# Patient Record
Sex: Male | Born: 1980
Health system: Southern US, Community
[De-identification: ages and names within clinical notes are randomized; demographics above are authoritative.]

---

## 1999-07-23 ENCOUNTER — Emergency Department (HOSPITAL_COMMUNITY): Admission: EM | Admit: 1999-07-23 | Discharge: 1999-07-23 | Payer: Self-pay | Admitting: Emergency Medicine

## 2009-02-02 ENCOUNTER — Emergency Department (HOSPITAL_COMMUNITY): Admission: EM | Admit: 2009-02-02 | Discharge: 2009-02-02 | Payer: Self-pay | Admitting: Emergency Medicine

## 2010-09-14 IMAGING — CT CT ABDOMEN W/ CM
2 of 4 series · 17 of 46 positions shown, 19 images · IV contrast (agent unspecified)
Comparison: None.

CT ABDOMEN

CLINICAL DATA: Mid abdominal pain and vomiting.

CT ABDOMEN AND PELVIS WITH CONTRAST
TECHNIQUE: Multidetector CT imaging of the abdomen and pelvis was
performed using the standard protocol following bolus
administration of intravenous contrast.
Contrast: 100 ml [id number]

[Series 2: rtn a/p with · axial · 0.63mm/px · z∈[-472,-56]mm · 14 of 91 slices shown, 16 images]
[im 4/91  soft-tissue]
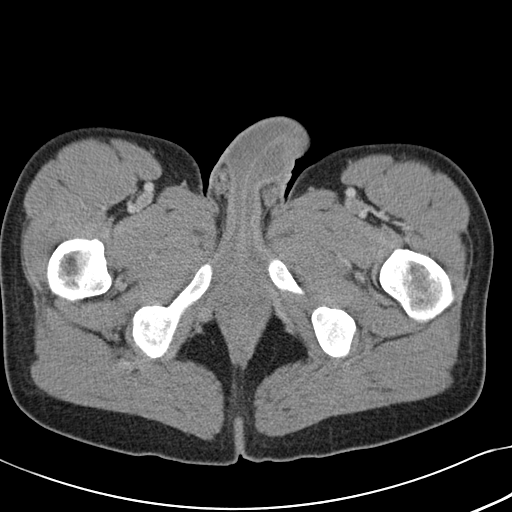
[im 4/91  bone]
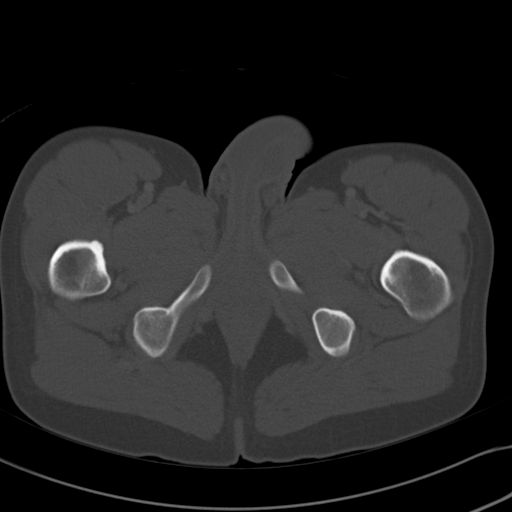
[im 11/91  soft-tissue]
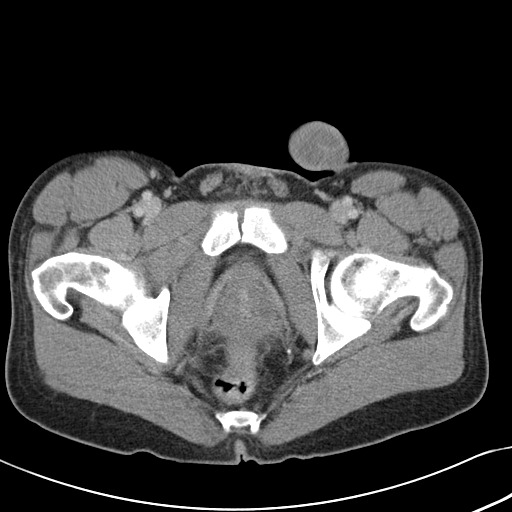
[im 19/91  soft-tissue]
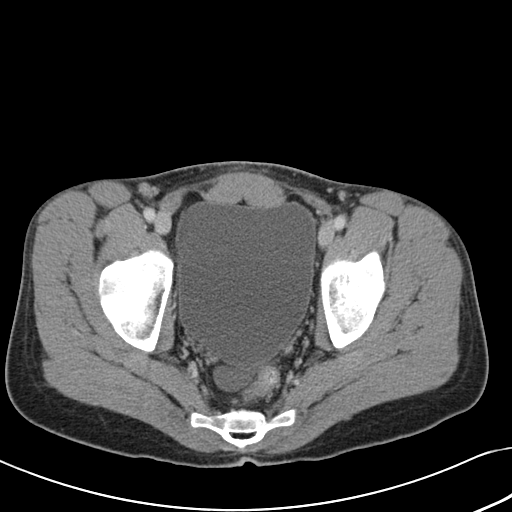
[im 26/91  soft-tissue]
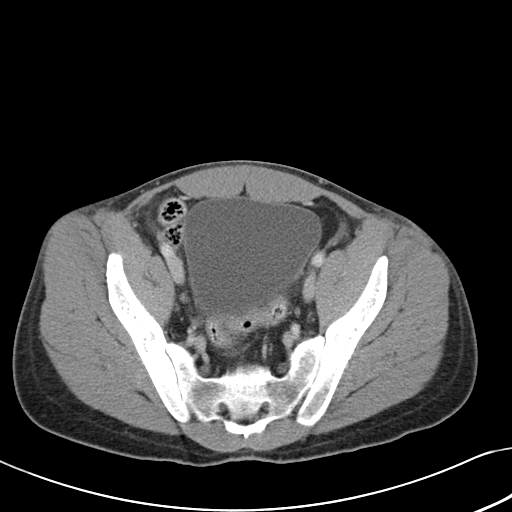
[im 29/91  soft-tissue]
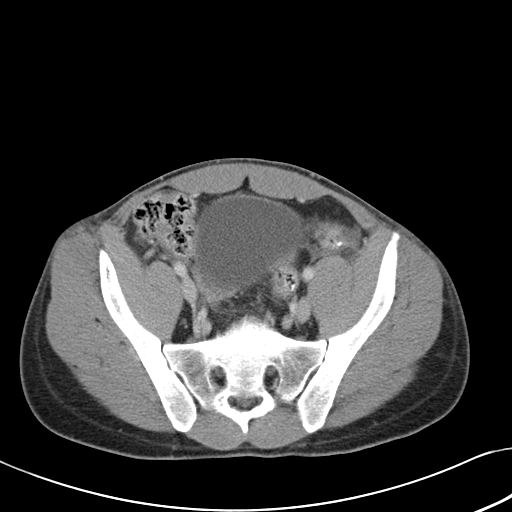
[im 37/91  soft-tissue]
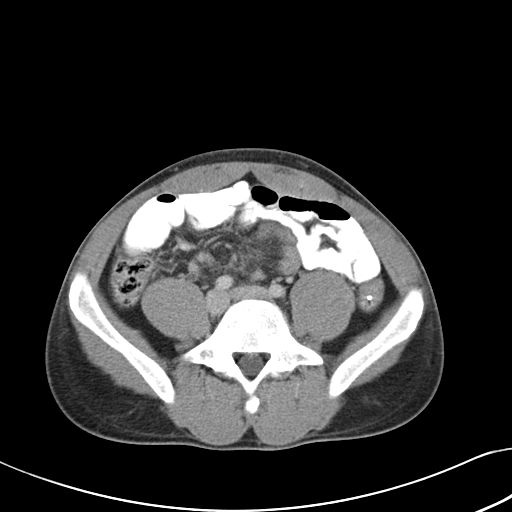
[im 44/91  soft-tissue]
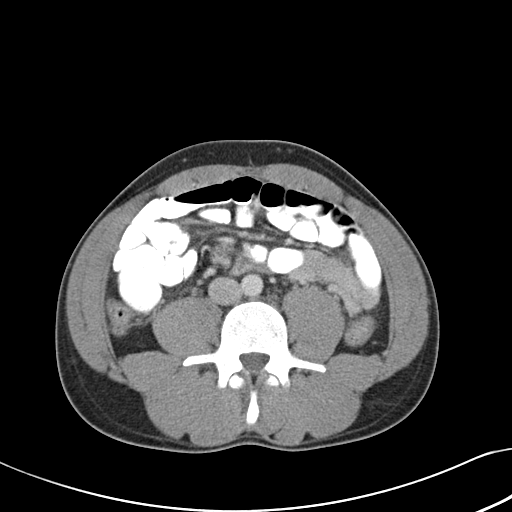
[im 47/91  soft-tissue]
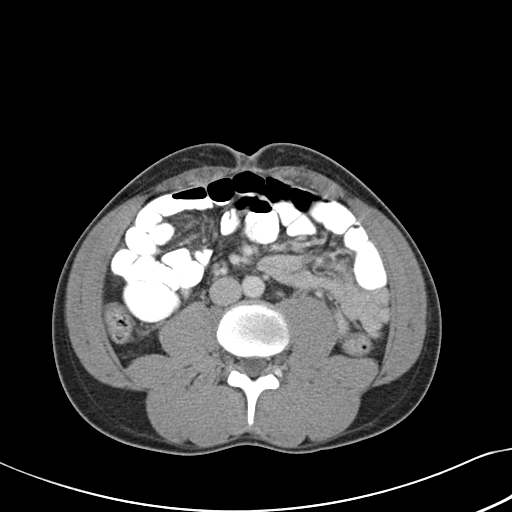
[im 55/91  soft-tissue]
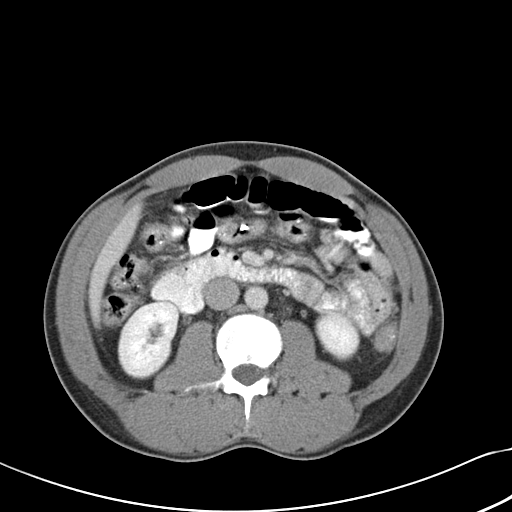
[im 55/91  bone]
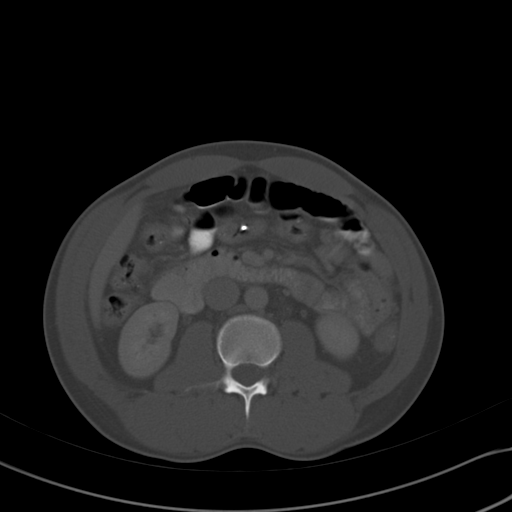
[im 62/91  soft-tissue]
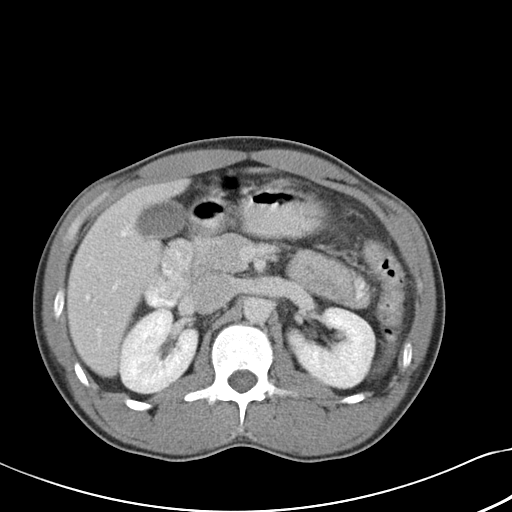
[im 69/91  soft-tissue]
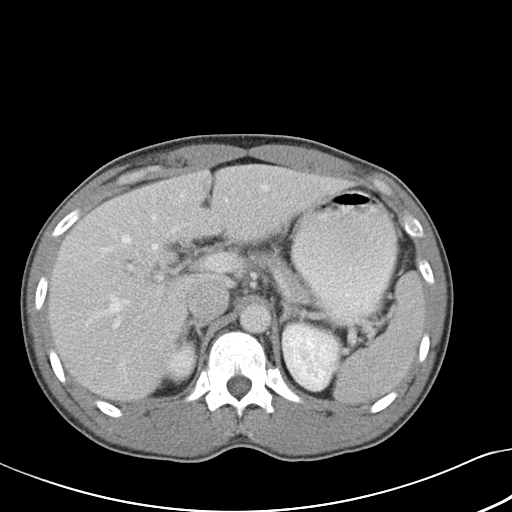
[im 73/91  soft-tissue]
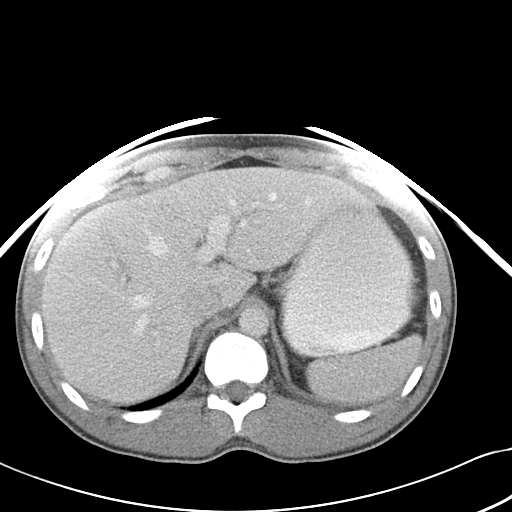
[im 80/91  soft-tissue]
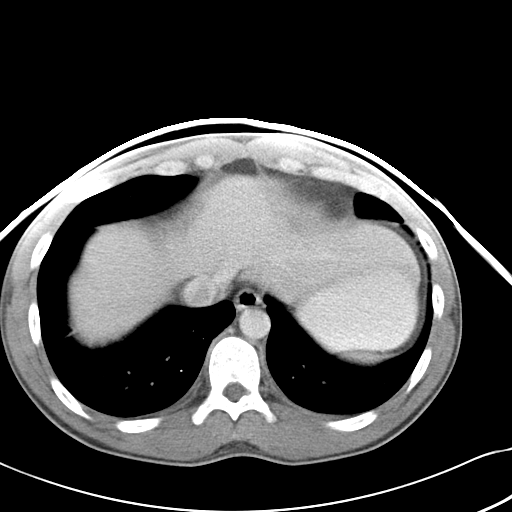
[im 87/91  soft-tissue]
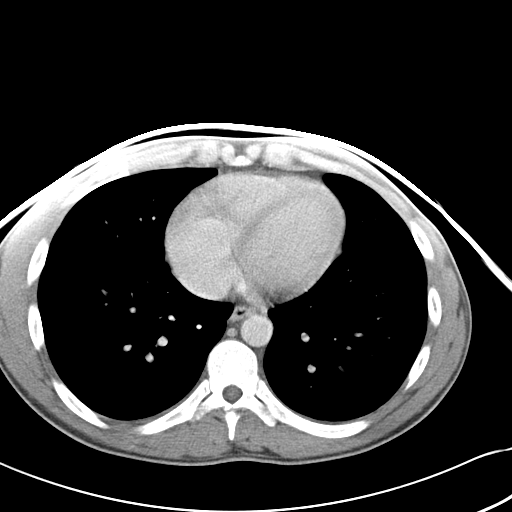

[Series 602: <mpr thick range> · coronal · 0.88mm/px · 3 of 65 slices shown]
[im 22/65  soft-tissue]
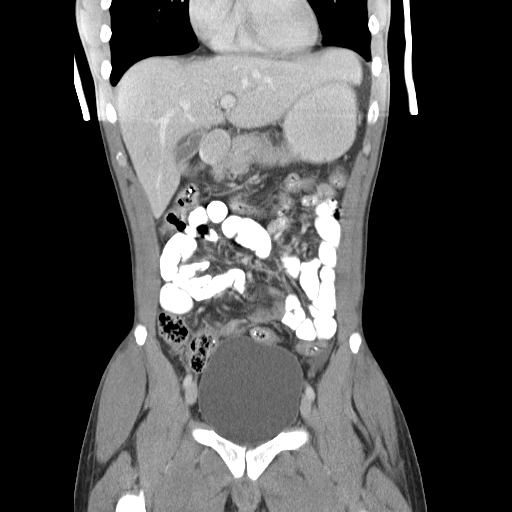
[im 29/65  soft-tissue]
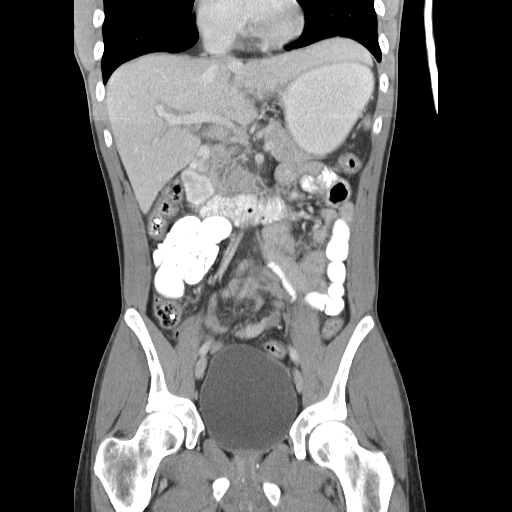
[im 36/65  soft-tissue]
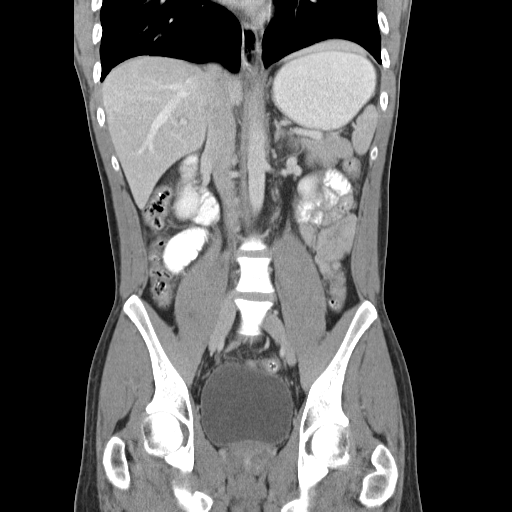

[17 of 46 positions shown; findings below may reference images not displayed]

FINDINGS: Lung bases show minimal subpleural atelectasis
dependently.  Heart size normal.  No pericardial or pleural
effusion.

Liver, gallbladder, adrenal glands, kidneys, spleen, pancreas,
stomach and small bowel are unremarkable.  No pathologically
enlarged lymph nodes.  No free fluid.
IMPRESSION: No acute findings in the abdomen.

CT PELVIS
FINDINGS: There is a small amount of free fluid.  Colon and
appendix are unremarkable.  No pathologically enlarged lymph nodes.
No worrisome lytic or sclerotic lesions.
IMPRESSION: Small amount of free fluid.

## 2011-03-27 LAB — POCT I-STAT, CHEM 8
BUN: 5 mg/dL — ABNORMAL LOW (ref 6–23)
Creatinine, Ser: 1 mg/dL (ref 0.4–1.5)
Glucose, Bld: 106 mg/dL — ABNORMAL HIGH (ref 70–99)
Hemoglobin: 15.3 g/dL (ref 13.0–17.0)
Potassium: 3.5 mEq/L (ref 3.5–5.1)

## 2011-03-27 LAB — CBC
Platelets: 151 10*3/uL (ref 150–400)
RDW: 13 % (ref 11.5–15.5)

## 2011-03-27 LAB — DIFFERENTIAL
Basophils Relative: 0 % (ref 0–1)
Monocytes Absolute: 0.6 10*3/uL (ref 0.1–1.0)
Monocytes Relative: 10 % (ref 3–12)
Neutro Abs: 3.8 10*3/uL (ref 1.7–7.7)

## 2011-03-27 LAB — COMPREHENSIVE METABOLIC PANEL
ALT: 13 U/L (ref 0–53)
Albumin: 4.1 g/dL (ref 3.5–5.2)
Alkaline Phosphatase: 80 U/L (ref 39–117)
BUN: 8 mg/dL (ref 6–23)
Potassium: 3.5 mEq/L (ref 3.5–5.1)
Sodium: 133 mEq/L — ABNORMAL LOW (ref 135–145)
Total Protein: 7 g/dL (ref 6.0–8.3)

## 2020-11-02 ENCOUNTER — Ambulatory Visit: Admit: 2020-11-02 | Disposition: A | Discharge status: Home / Self Care | Payer: Self-pay

## 2020-11-02 ENCOUNTER — Encounter: Payer: Self-pay | Admitting: Emergency Medicine

## 2020-11-02 ENCOUNTER — Ambulatory Visit
Admission: EM | Admit: 2020-11-02 | Discharge: 2020-11-02 | Disposition: A | Discharge status: Home / Self Care | Payer: 59 | Attending: Emergency Medicine | Admitting: Emergency Medicine

## 2020-11-02 ENCOUNTER — Ambulatory Visit: Payer: 59

## 2020-11-02 ENCOUNTER — Ambulatory Visit (INDEPENDENT_AMBULATORY_CARE_PROVIDER_SITE_OTHER): Payer: 59

## 2020-11-02 ENCOUNTER — Other Ambulatory Visit: Payer: Self-pay

## 2020-11-02 DIAGNOSIS — J209 Acute bronchitis, unspecified: Secondary | ICD-10-CM | POA: Diagnosis not present

## 2020-11-02 DIAGNOSIS — R059 Cough, unspecified: Secondary | ICD-10-CM | POA: Diagnosis not present

## 2020-11-02 MED ORDER — PREDNISONE 10 MG (21) PO TBPK: ORAL_TABLET | Freq: Every day | ORAL | 0 refills | Status: AC

## 2020-11-02 MED ORDER — AMOXICILLIN-POT CLAVULANATE 875-125 MG PO TABS: 1.0000 | ORAL_TABLET | Freq: Two times a day (BID) | ORAL | 0 refills | Status: AC

## 2020-11-02 MED ORDER — ALBUTEROL SULFATE HFA 108 (90 BASE) MCG/ACT IN AERS: 2.0000 | INHALATION_SPRAY | RESPIRATORY_TRACT | 0 refills | Status: DC | PRN

## 2020-11-02 NOTE — ED Triage Notes (Addendum)
Patient c/o nonproductive cough x 1.5 months.   Patient was seen at an Urgent Care center 3 weeks ago and was prescribed a cough suppressant and Azithromycin.  Patient stated symptoms didn't resolve after finishing medication.   Patient endorses chest pressure when laying down. Patient stated " I have difficulty sleeping at night and have to put several pillows under my head to get comfortable , but some days are better than others, symptoms always worsen when laying down".   Patient endorses sneezing.   Patient states he has an appointment with a specialist for possible allergies.   History of Tobacco Use.

## 2020-11-02 NOTE — ED Provider Notes (Signed)
[name]    CSN: [id number] Arrival date & time: 11/02/20  1435      History   Chief Complaint Chief Complaint  Patient presents with  . Cough    HPI Justin Stephenson is a 39 y.o. male.   Patient presents with 1 month history of cough productive of unknown color phlegm.  He states when he is coughing or lying down he has chest pressure.  He also reports sneezing and congestion.  He states he was seen at a different urgent care 3 weeks ago and treated with Zithromax but his symptoms did not improve.  He denies fever, sore throat, shortness of breath, vomiting, diarrhea, or other symptoms.  He denies pertinent medical history.  The history is provided by the patient.    History reviewed. No pertinent past medical history.  There are no problems to display for this patient.   History reviewed. No pertinent surgical history.     Home Medications    Prior to Admission medications   Medication Sig Start Date End Date Taking? Authorizing Provider  albuterol (VENTOLIN HFA) 108 (90 Base) MCG/ACT inhaler Inhale 2 puffs into the lungs every 4 (four) hours as needed for wheezing or shortness of breath. 11/02/20   Mickie Bail, NP  amoxicillin-clavulanate (AUGMENTIN) 875-125 MG tablet Take 1 tablet by mouth every 12 (twelve) hours. 11/02/20   Mickie Bail, NP  predniSONE (STERAPRED UNI-PAK 21 TAB) 10 MG (21) TBPK tablet Take by mouth daily. As directed 11/02/20   Mickie Bail, NP    Family History History reviewed. No pertinent family history.  Social History Social History   Tobacco Use  . Smoking status: Current Every Day Smoker    Packs/day: 0.25    Types: Cigarettes  . Smokeless tobacco: Never Used  Substance Use Topics  . Alcohol use: Yes    Alcohol/week: 12.0 standard drinks    Types: 12 Cans of beer per week  . Drug use: Never     Allergies   Patient has no known allergies.   Review of Systems Review of Systems  Constitutional: Negative for  chills and fever.  HENT: Positive for congestion and sneezing. Negative for ear pain and sore throat.   Eyes: Negative for pain and visual disturbance.  Respiratory: Positive for cough and chest tightness. Negative for shortness of breath.   Cardiovascular: Negative for chest pain and palpitations.  Gastrointestinal: Negative for abdominal pain, diarrhea and vomiting.  Genitourinary: Negative for dysuria and hematuria.  Musculoskeletal: Negative for arthralgias and back pain.  Skin: Negative for color change and rash.  Neurological: Negative for seizures and syncope.  All other systems reviewed and are negative.    Physical Exam Triage Vital Signs ED Triage Vitals [11/02/20 1456]  Enc Vitals Group     BP 113/77     Pulse Rate 80     Resp 16     Temp 98.2 F (36.8 C)     Temp Source Oral     SpO2 97 %     Weight      Height      Head Circumference      Peak Flow      Pain Score      Pain Loc      Pain Edu?      Excl. in GC?    No data found.  Updated Vital Signs BP 113/77 (BP Location: Left Arm)   Pulse 80   Temp 98.2 F (36.8 C) (  Oral)   Resp 16   SpO2 97%   Visual Acuity Right Eye Distance:   Left Eye Distance:   Bilateral Distance:    Right Eye Near:   Left Eye Near:    Bilateral Near:     Physical Exam Vitals and nursing note reviewed.  Constitutional:      General: He is not in acute distress.    Appearance: He is well-developed.  HENT:     Head: Normocephalic and atraumatic.     Right Ear: Tympanic membrane normal.     Left Ear: Tympanic membrane normal.     Nose: Nose normal.     Mouth/Throat:     Mouth: Mucous membranes are moist.     Pharynx: Oropharynx is clear.  Eyes:     Conjunctiva/sclera: Conjunctivae normal.  Cardiovascular:     Rate and Rhythm: Normal rate and regular rhythm.     Heart sounds: Normal heart sounds.  Pulmonary:     Effort: Pulmonary effort is normal. No respiratory distress.     Breath sounds: Rhonchi present.    Abdominal:     Palpations: Abdomen is soft.     Tenderness: There is no abdominal tenderness. There is no guarding or rebound.  Musculoskeletal:     Cervical back: Neck supple.  Skin:    General: Skin is warm and dry.     Findings: No rash.  Neurological:     General: No focal deficit present.     Mental Status: He is alert and oriented to person, place, and time.     Gait: Gait normal.  Psychiatric:        Mood and Affect: Mood normal.        Behavior: Behavior normal.      UC Treatments / Results  Labs (all labs ordered are listed, but only abnormal results are displayed) Labs Reviewed - No data to display  EKG   Radiology DG Chest 2 View  Result Date: 11/02/2020 CLINICAL DATA:  Productive cough for several weeks EXAM: CHEST - 2 VIEW COMPARISON:  None. FINDINGS: Cardiac shadow is within normal limits. Mild increased bronchitic markings are noted of uncertain chronicity. No focal confluent infiltrate is seen. No effusion is noted. No bony abnormality is noted. IMPRESSION: Mild increased bronchitic markings of uncertain chronicity. No other focal abnormality is noted. Electronically Signed   By: Alcide Clever M.D.   On: 11/02/2020 15:36    Procedures Procedures (including critical care time)  Medications Ordered in UC Medications - No data to display  Initial Impression / Assessment and Plan / UC Course  I have reviewed the triage vital signs and the nursing notes.  Pertinent labs & imaging results that were available during my care of the patient were reviewed by me and considered in my medical decision making (see chart for details).   Acute bronchitis.  Patient declines EKG or transfer to the ED. x-ray shows "mild increased bronchitic markings of uncertain chronicity."  Treating with Augmentin, prednisone, albuterol inhaler.  Instructed patient to follow-up with his PCP if his symptoms are not improving.  Patient agrees to plan of care.   Final Clinical  Impressions(s) / UC Diagnoses   Final diagnoses:  Acute bronchitis, unspecified organism     Discharge Instructions     Use the albuterol inhaler and take the Augmentin and prednisone as directed.    Follow up with your primary care provider if your symptoms are not improving.       ED  Prescriptions    Medication Sig Dispense Auth. Provider   amoxicillin-clavulanate (AUGMENTIN) 875-125 MG tablet Take 1 tablet by mouth every 12 (twelve) hours. 14 tablet Mickie Bail, NP   predniSONE (STERAPRED UNI-PAK 21 TAB) 10 MG (21) TBPK tablet Take by mouth daily. As directed 21 tablet Mickie Bail, NP   albuterol (VENTOLIN HFA) 108 (90 Base) MCG/ACT inhaler Inhale 2 puffs into the lungs every 4 (four) hours as needed for wheezing or shortness of breath. 18 g Mickie Bail, NP     I have reviewed the PDMP during this encounter.   Mickie Bail, NP 11/02/20 1600

## 2020-11-02 NOTE — Discharge Instructions (Addendum)
Use the albuterol inhaler and take the Augmentin and prednisone as directed.    Follow up with your primary care provider if your symptoms are not improving.

## 2020-11-08 ENCOUNTER — Institutional Professional Consult (permissible substitution): Payer: Self-pay | Admitting: Pulmonary Disease

## 2021-04-13 ENCOUNTER — Ambulatory Visit
Admission: EM | Admit: 2021-04-13 | Discharge: 2021-04-13 | Disposition: A | Discharge status: Home / Self Care | Payer: 59

## 2021-04-13 ENCOUNTER — Other Ambulatory Visit: Payer: Self-pay

## 2021-04-13 ENCOUNTER — Encounter: Payer: Self-pay | Admitting: Emergency Medicine

## 2021-04-13 DIAGNOSIS — B349 Viral infection, unspecified: Secondary | ICD-10-CM | POA: Diagnosis not present

## 2021-04-13 DIAGNOSIS — R509 Fever, unspecified: Secondary | ICD-10-CM | POA: Diagnosis not present

## 2021-04-13 MED ORDER — ACETAMINOPHEN 325 MG PO TABS
650.0000 mg | ORAL_TABLET | Freq: Once | ORAL | Status: AC
  Administered 2021-04-13: 650 mg via ORAL

## 2021-04-13 MED ORDER — ALBUTEROL SULFATE HFA 108 (90 BASE) MCG/ACT IN AERS: 2.0000 | INHALATION_SPRAY | RESPIRATORY_TRACT | 0 refills | Status: AC | PRN

## 2021-04-13 NOTE — ED Triage Notes (Signed)
Symptoms started last night.  Nothing specific, but not feeling well.  Today feeling weak, has cough, and headache. Patient is also complaining of chest congestion.  Has had delsym this morning.  Patient used inhaler last night

## 2021-04-13 NOTE — Discharge Instructions (Addendum)
Your COVID and Influenza tests are pending.  You should self quarantine until the test results are back.    Take Tylenol or ibuprofen as needed for fever or discomfort.  Rest and keep yourself hydrated.    Follow-up with your primary care provider if your symptoms are not improving.     

## 2021-04-13 NOTE — ED Provider Notes (Signed)
Justin Stephenson    CSN: [id number] Arrival date & time: 04/13/21  1522      History   Chief Complaint Chief Complaint  Patient presents with  . Cough    HPI Justin Stephenson is a 40 y.o. male.   Patient presents with fever, fatigue, generalized weakness, headache, nonproductive cough since yesterday evening.  He denies wheezing, shortness of breath, vomiting, diarrhea, or other symptoms.  Treatment attempted at home with Delsym and albuterol inhaler yesterday evening; no treatments today.  He denies pertinent medical history.  The history is provided by the patient and the spouse.    History reviewed. No pertinent past medical history.  There are no problems to display for this patient.   History reviewed. No pertinent surgical history.     Home Medications    Prior to Admission medications   Medication Sig Start Date End Date Taking? Authorizing Provider  Dextromethorphan HBr (DELSYM PO) Take by mouth.   Yes [provider]  albuterol (VENTOLIN HFA) 108 (90 Base) MCG/ACT inhaler Inhale 2 puffs into the lungs every 4 (four) hours as needed for wheezing or shortness of breath. 04/13/21   Mickie Bail, NP  amoxicillin-clavulanate (AUGMENTIN) 875-125 MG tablet Take 1 tablet by mouth every 12 (twelve) hours. Patient not taking: Reported on 04/13/2021 11/02/20   Mickie Bail, NP  predniSONE (STERAPRED UNI-PAK 21 TAB) 10 MG (21) TBPK tablet Take by mouth daily. As directed Patient not taking: Reported on 04/13/2021 11/02/20   Mickie Bail, NP    Family History Family History  Problem Relation Age of Onset  . Cancer Mother   . Hypertension Father     Social History Social History   Tobacco Use  . Smoking status: Current Every Day Smoker    Packs/day: 0.25    Types: Cigarettes  . Smokeless tobacco: Never Used  Vaping Use  . Vaping Use: Never used  Substance Use Topics  . Alcohol use: Yes    Alcohol/week: 12.0 standard drinks    Types: 12 Cans of beer  per week  . Drug use: Never     Allergies   Patient has no known allergies.   Review of Systems Review of Systems  Constitutional: Positive for fatigue and fever. Negative for chills.  HENT: Negative for ear pain and sore throat.   Respiratory: Positive for cough. Negative for shortness of breath and wheezing.   Cardiovascular: Negative for chest pain and palpitations.  Gastrointestinal: Negative for abdominal pain, diarrhea and vomiting.  Musculoskeletal: Negative for arthralgias and back pain.  Skin: Negative for color change and rash.  Neurological: Positive for weakness and headaches. Negative for seizures and syncope.  All other systems reviewed and are negative.    Physical Exam Triage Vital Signs ED Triage Vitals  Enc Vitals Group     BP 04/13/21 1542 101/60     Pulse Rate 04/13/21 1542 (!) 118     Resp 04/13/21 1542 (!) 24     Temp 04/13/21 1542 (!) 102.6 F (39.2 C)     Temp src --      SpO2 04/13/21 1542 92 %     Weight --      Height --      Head Circumference --      Peak Flow --      Pain Score 04/13/21 1538 5     Pain Loc --      Pain Edu? --      Excl. in GC? --  No data found.  Updated Vital Signs BP 101/60 (BP Location: Left Arm)   Pulse (!) 118   Temp (!) 102.6 F (39.2 C)   Resp (!) 24   SpO2 95%   Visual Acuity Right Eye Distance:   Left Eye Distance:   Bilateral Distance:    Right Eye Near:   Left Eye Near:    Bilateral Near:     Physical Exam Vitals and nursing note reviewed.  Constitutional:      General: He is not in acute distress.    Appearance: He is well-developed. He is ill-appearing.  HENT:     Head: Normocephalic and atraumatic.     Right Ear: Tympanic membrane normal.     Left Ear: Tympanic membrane normal.     Nose: Nose normal.     Mouth/Throat:     Mouth: Mucous membranes are moist.     Pharynx: Oropharynx is clear.  Eyes:     Conjunctiva/sclera: Conjunctivae normal.  Cardiovascular:     Rate and  Rhythm: Normal rate and regular rhythm.     Heart sounds: Normal heart sounds.  Pulmonary:     Effort: Pulmonary effort is normal. No respiratory distress.     Breath sounds: Normal breath sounds. No wheezing or rhonchi.  Abdominal:     Palpations: Abdomen is soft.     Tenderness: There is no abdominal tenderness.  Musculoskeletal:     Cervical back: Neck supple.  Skin:    General: Skin is warm and dry.  Neurological:     General: No focal deficit present.     Mental Status: He is alert and oriented to person, place, and time.     Gait: Gait normal.  Psychiatric:        Mood and Affect: Mood normal.        Behavior: Behavior normal.      UC Treatments / Results  Labs (all labs ordered are listed, but only abnormal results are displayed) Labs Reviewed  COVID-19, FLU A+B NAA    EKG   Radiology No results found.  Procedures Procedures (including critical care time)  Medications Ordered in UC Medications  acetaminophen (TYLENOL) tablet 650 mg (650 mg Oral Given 04/13/21 1600)    Initial Impression / Assessment and Plan / UC Course  I have reviewed the triage vital signs and the nursing notes.  Pertinent labs & imaging results that were available during my care of the patient were reviewed by me and considered in my medical decision making (see chart for details).   Fever, viral illness.  Tylenol given here.  O2 sat increased to 95% on room air.  Influenza and COVID pending.  Instructed patient to self quarantine until the test results are back.  Discussed symptomatic treatment including Tylenol or ibuprofen, rest, hydration.  Strict ED precautions discussed for shortness of breath or other concerning symptoms.  Instructed patient to follow up with PCP if his symptoms are not improving.  Patient agrees to plan of care.    Final Clinical Impressions(s) / UC Diagnoses   Final diagnoses:  Fever, unspecified  Viral illness     Discharge Instructions     Your COVID  and Influenza tests are pending.  You should self quarantine until the test results are back.    Take Tylenol or ibuprofen as needed for fever or discomfort.  Rest and keep yourself hydrated.    Follow-up with your primary care provider if your symptoms are not improving.  ED Prescriptions    Medication Sig Dispense Auth. Provider   albuterol (VENTOLIN HFA) 108 (90 Base) MCG/ACT inhaler Inhale 2 puffs into the lungs every 4 (four) hours as needed for wheezing or shortness of breath. 18 g Mickie Bail, NP     PDMP not reviewed this encounter.   Mickie Bail, NP 04/13/21 [phone]

## 2021-04-15 LAB — COVID-19, FLU A+B NAA
Influenza A, NAA: NOT DETECTED
Influenza B, NAA: NOT DETECTED
SARS-CoV-2, NAA: NOT DETECTED

## 2022-06-14 IMAGING — DX DG CHEST 2V
2 series · 2 of 2 positions shown · non-contrast
Comparison: None.

CLINICAL DATA: Productive cough for several weeks

EXAM:
CHEST - 2 VIEW

[chest pa]
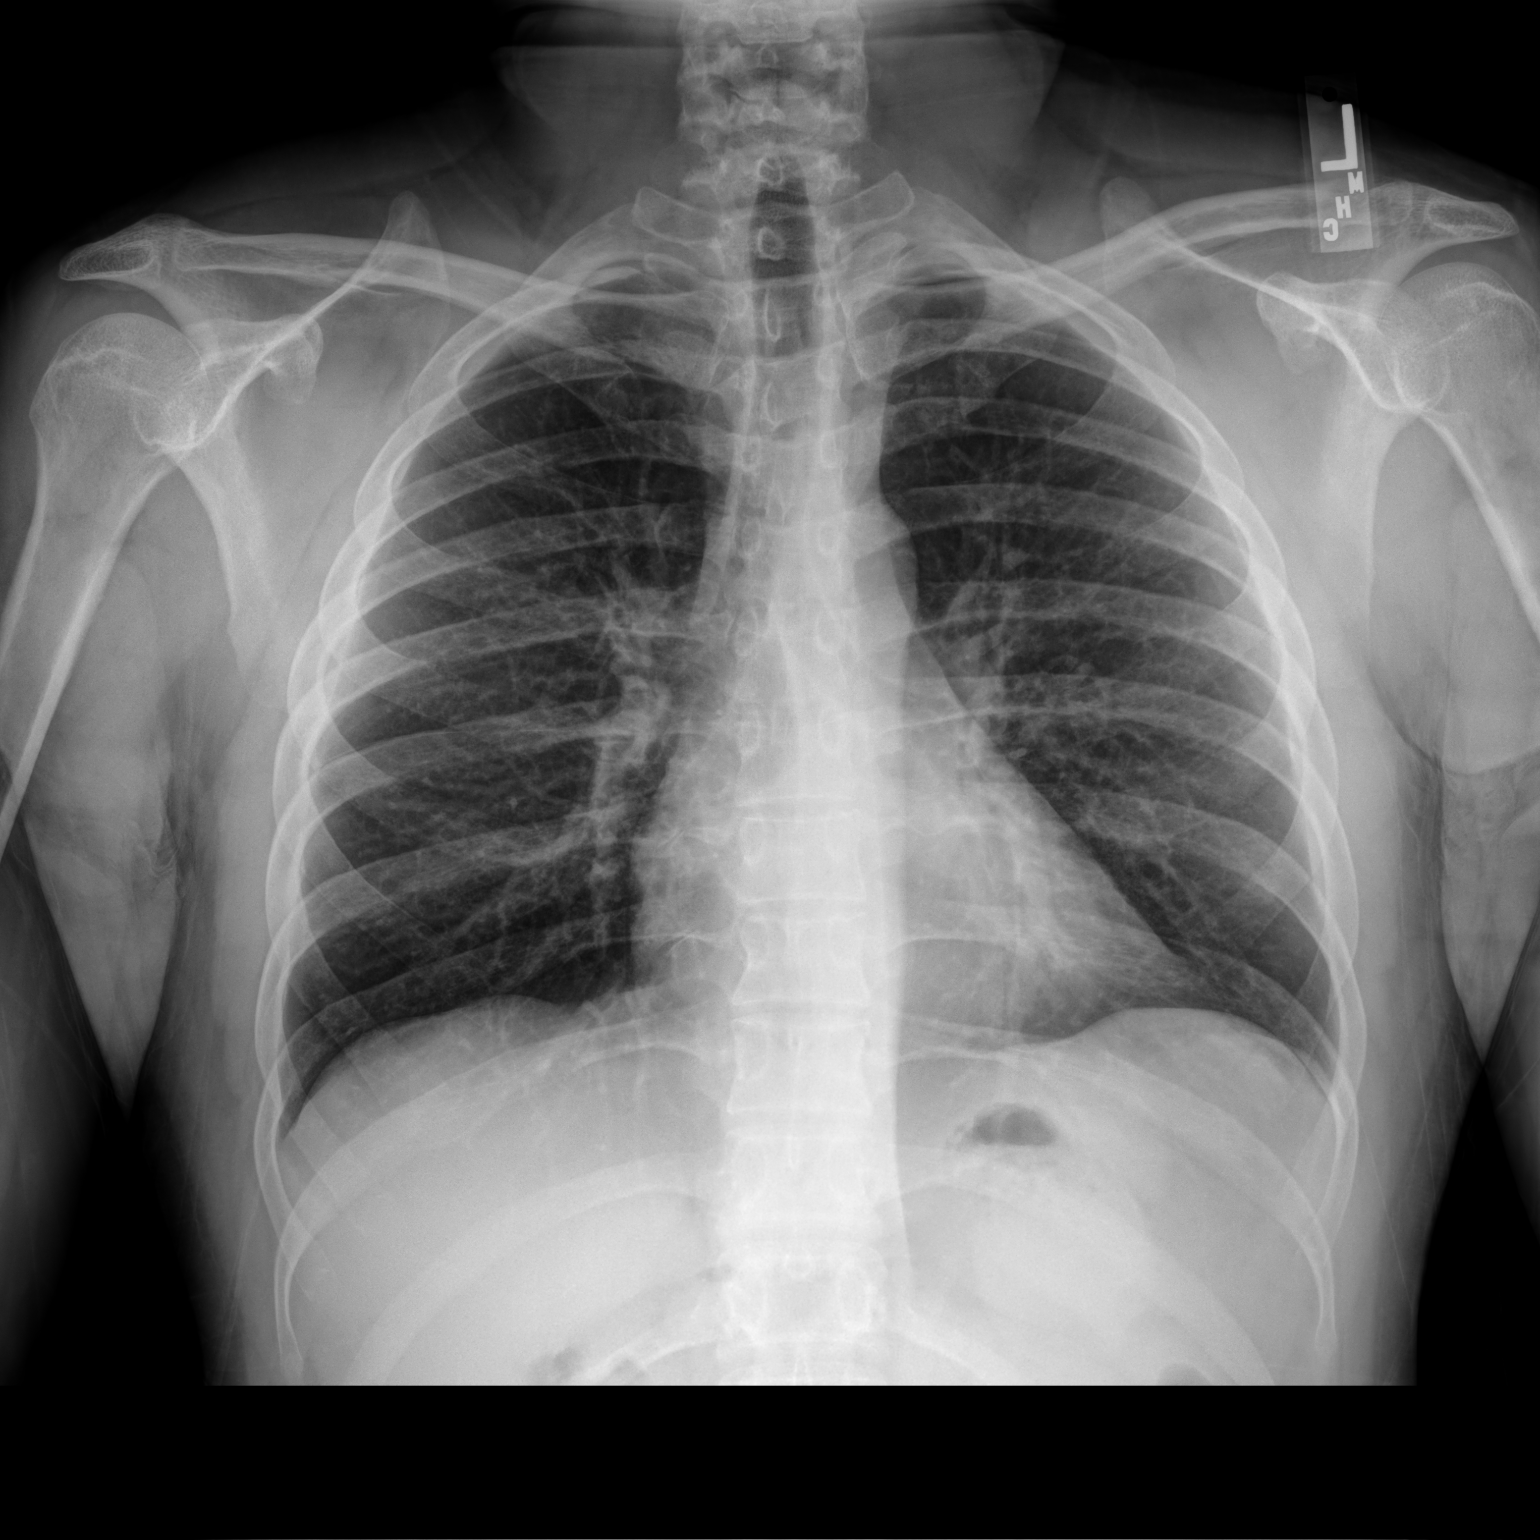

[chest lat]
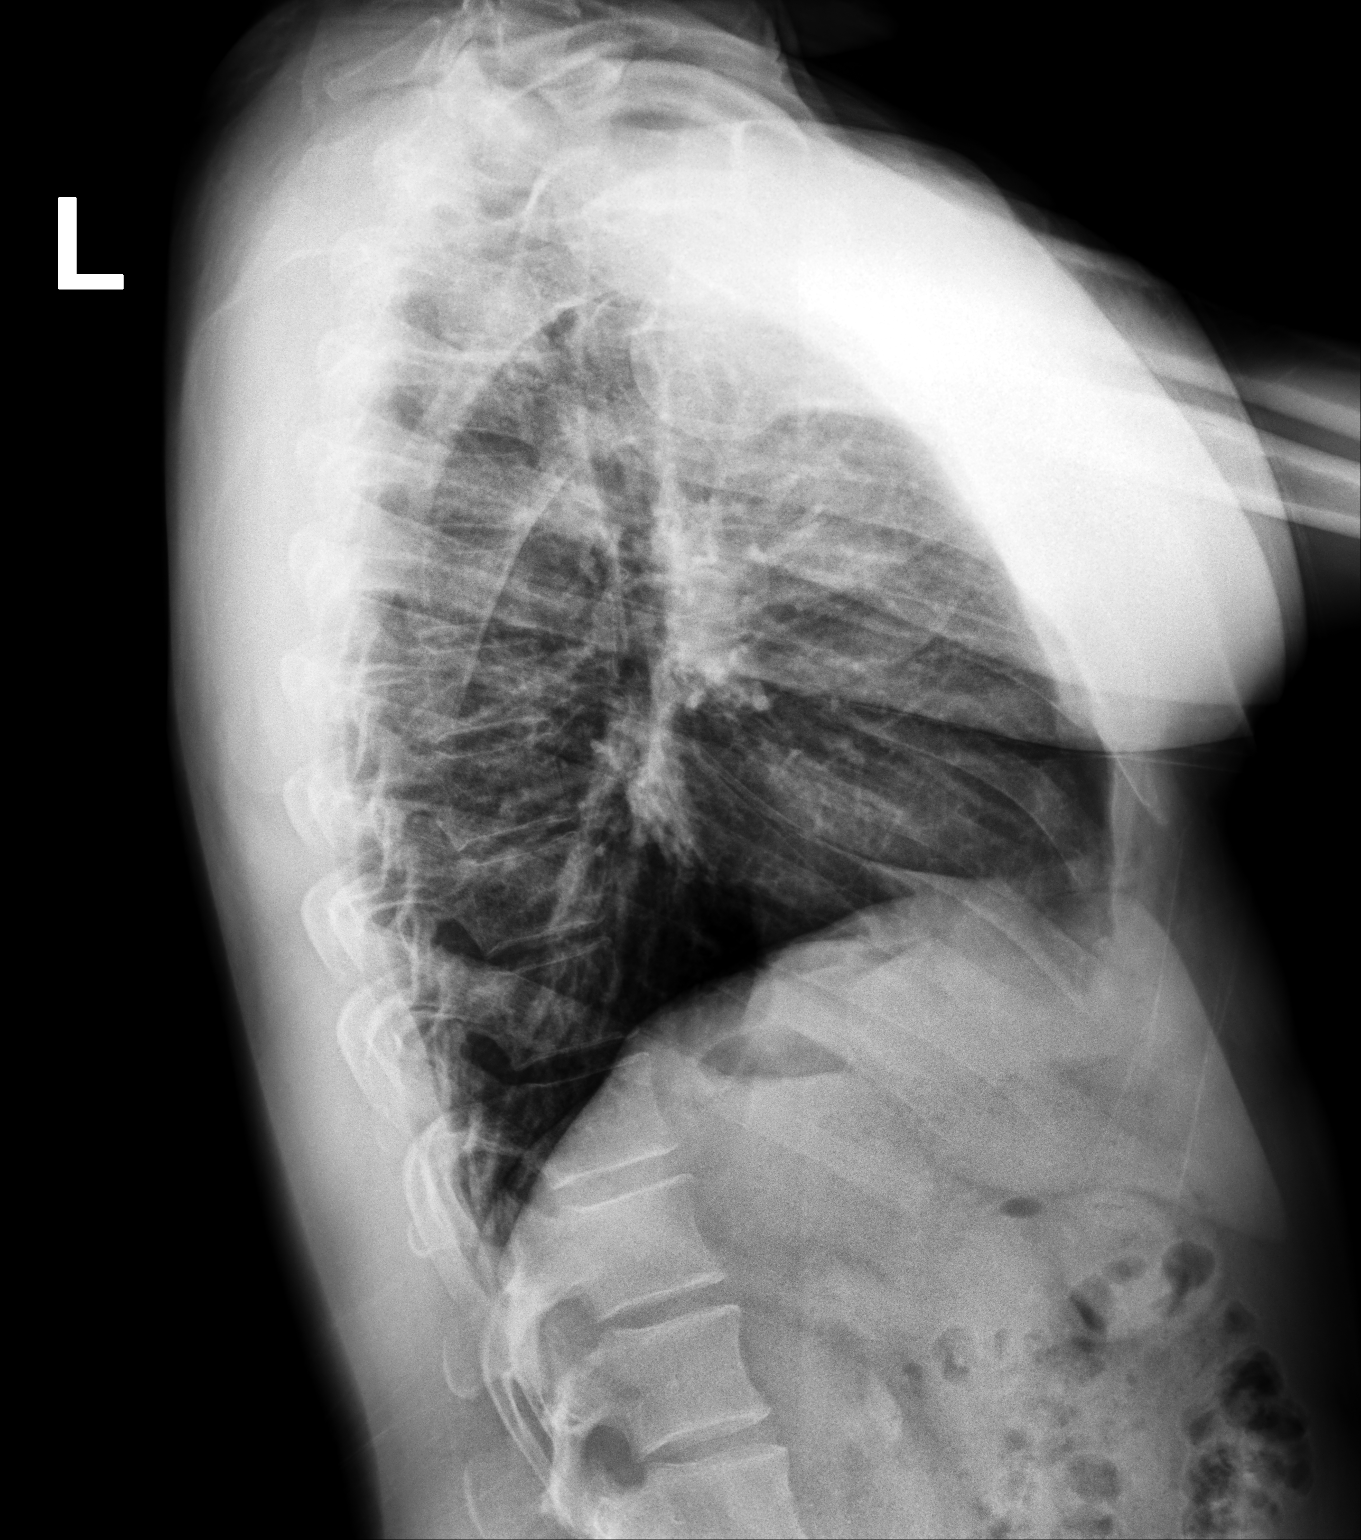

[2 of 2 positions shown; findings below may reference images not displayed]

FINDINGS: Cardiac shadow is within normal limits. Mild increased bronchitic
markings are noted of uncertain chronicity. No focal confluent
infiltrate is seen. No effusion is noted. No bony abnormality is
noted.
IMPRESSION: Mild increased bronchitic markings of uncertain chronicity. No other
focal abnormality is noted.

## 2024-02-21 NOTE — Progress Notes (Addendum)
 Subjective Patient ID: Justin Stephenson is a 43 y.o. male.  Justin Stephenson is a 43 y.o. male presents for concern of diarrhea x 2 days. Upset stomach which improved some today. Has been eating and drinking without issue. Denies abdominal pain. Also with cough x months which seemed worse last night. Intermittent shortness of breath, but not right now.    History provided by:  Patient Language interpreter used: No   Cough Associated symptoms include postnasal drip and rhinorrhea.  Diarrhea   Review of Systems  HENT:  Positive for postnasal drip and rhinorrhea.   Respiratory:  Positive for cough.   Gastrointestinal:  Positive for diarrhea.    Patient History  Allergies: No Known Allergies   History reviewed. No pertinent past medical history. History reviewed. No pertinent surgical history. Social History   Socioeconomic History  . Marital status: Married    Spouse name: Not on file  . Number of children: Not on file  . Years of education: Not on file  . Highest education level: Not on file  Occupational History  . Not on file  Tobacco Use  . Smoking status: Every Day    Types: Cigarettes  . Smokeless tobacco: Never  Substance and Sexual Activity  . Alcohol use: Not on file  . Drug use: Never  . Sexual activity: Not on file  Other Topics Concern  . Not on file  Social History Narrative  . Not on file   History reviewed. No pertinent family history. No current outpatient medications on file prior to visit.   No current facility-administered medications on file prior to visit.     Objective  Vitals:   02/21/24 1130  BP: 125/81  Pulse: 92  Resp: 18  Temp: 37.1 C (98.8 F)  TempSrc: Tympanic  SpO2: 100%  Weight: 83.9 kg  Height: 5' 5  PainSc: 0-No pain                No results found.  Physical Exam Vitals and nursing note reviewed.  Constitutional:      General: He is not in acute distress.    Appearance: Normal appearance.  HENT:      Right Ear: Tympanic membrane normal.     Left Ear: Tympanic membrane normal.     Nose: Rhinorrhea present.     Mouth/Throat:     Pharynx: Posterior oropharyngeal erythema present.     Comments: Post nasal drip noted, mild erythema Eyes:     Extraocular Movements: Extraocular movements intact.     Conjunctiva/sclera: Conjunctivae normal.     Pupils: Pupils are equal, round, and reactive to light.  Cardiovascular:     Rate and Rhythm: Normal rate and regular rhythm.     Pulses: Normal pulses.     Heart sounds: Normal heart sounds.  Pulmonary:     Effort: Pulmonary effort is normal.     Breath sounds: Normal breath sounds.  Musculoskeletal:        General: Normal range of motion.     Cervical back: Normal range of motion and neck supple. No tenderness.  Lymphadenopathy:     Cervical: No cervical adenopathy.  Skin:    General: Skin is warm.     Capillary Refill: Capillary refill takes less than 2 seconds.  Neurological:     General: No focal deficit present.     Mental Status: He is alert.  Psychiatric:        Mood and Affect: Mood normal.  Thought Content: Thought content normal.        Judgment: Judgment normal.      Results for orders placed or performed in visit on 02/21/24  POCT RAPID INFLUENZA MCKESSON  Component Result   Rapid Influenza A Ag Negative   Rapid Influenza B Ag Negative   Internal Quality Control Pass  POCT QuickVue Antigen Test  Component Result   Rapid Covid Negative   Internal Quality Control Pass     Procedures MDM:     1 Acute, uncomplicated illness or injury     Explanation of Medical Decision Making and variances from expected care:  Patient has used albuterol  with relief in past, possible asthma history but patient unsure   Review of any test results: Two     Assessment requiring historian other than patient: No     Independent visualization of image, tracing, or test: No     Discussion of management with another provider: No      Risk:: Moderate          Assessment/Plan Diagnoses and all orders for this visit:  Viral URI with cough -     POCT RAPID INFLUENZA MCKESSON -     POCT QuickVue Antigen Test -     benzonatate (Tessalon Perles) 100 MG capsule; T 1-2 tab po tid prn -     ibuprofen 600 MG tablet; Take 1 tablet (600 mg total) by mouth in the morning and 1 tablet (600 mg total) in the evening and 1 tablet (600 mg total) before bedtime. Do all this for 10 days. -     ipratropium (Atrovent) 0.06 % nasal spray; Administer 2 sprays into each nostril in the morning and 2 sprays at noon and 2 sprays in the evening and 2 sprays before bedtime.  History of environmental allergies -     ipratropium (Atrovent) 0.06 % nasal spray; Administer 2 sprays into each nostril in the morning and 2 sprays at noon and 2 sprays in the evening and 2 sprays before bedtime.  Other orders -     albuterol  108 (90 Base) MCG/ACT inhaler; Inhale 2 puffs every 6 (six) hours if needed for wheezing.      Disposition Status: Home  Progress note signed by Debby Rung, PA on 02/22/24 at  2:21 PM

## 2024-02-24 NOTE — Progress Notes (Addendum)
 Subjective Patient ID: Justin Stephenson is a 43 y.o. male.  Justin Stephenson is a 43 y.o. male who presents complaining of left sided rib pain and cough.  Patient states he had a particularly bad coughing fit last night that resulted in a sudden pain in his left lower chest wall.  He has increased pain with deep breaths, movement, and coughing.  Patient was seen on 3/14 and has been taking medications as prescribe without improvement in symptoms.     History provided by:  Patient Language interpreter used: No     Review of Systems  Constitutional:  Negative for chills and fever.  Respiratory:  Positive for cough. Negative for chest tightness, shortness of breath and wheezing.   Cardiovascular:  Positive for chest pain (left rib pain). Negative for palpitations.  Musculoskeletal:  Positive for myalgias (left chest wall).  Skin:  Negative for rash.    Patient History  Allergies: No Known Allergies   History reviewed. No pertinent past medical history. History reviewed. No pertinent surgical history. Social History   Socioeconomic History  . Marital status: Married    Spouse name: Not on file  . Number of children: Not on file  . Years of education: Not on file  . Highest education level: Not on file  Occupational History  . Not on file  Tobacco Use  . Smoking status: Every Day    Types: Cigarettes  . Smokeless tobacco: Never  Substance and Sexual Activity  . Alcohol use: Not on file  . Drug use: Never  . Sexual activity: Not on file  Other Topics Concern  . Not on file  Social History Narrative  . Not on file   History reviewed. No pertinent family history. Current Outpatient Medications on File Prior to Visit  Medication Sig Dispense Refill  . albuterol  108 (90 Base) MCG/ACT inhaler Inhale 2 puffs every 6 (six) hours if needed for wheezing. 18 g 0  . benzonatate (Tessalon Perles) 100 MG capsule T 1-2 tab po tid prn 20 capsule 0  . ibuprofen 600 MG tablet Take 1 tablet  (600 mg total) by mouth in the morning and 1 tablet (600 mg total) in the evening and 1 tablet (600 mg total) before bedtime. Do all this for 10 days. 30 tablet 0  . ipratropium (Atrovent) 0.06 % nasal spray Administer 2 sprays into each nostril in the morning and 2 sprays at noon and 2 sprays in the evening and 2 sprays before bedtime. 15 mL 0   No current facility-administered medications on file prior to visit.     Objective  Vitals:   02/24/24 1032  BP: 112/78  Pulse: 84  Resp: 19  Temp: 36.9 C (98.5 F)  TempSrc: Tympanic  SpO2: 99%  Weight: 83.9 kg  Height: 5' 5  PainSc: 10-Worst pain ever                No results found.  Physical Exam Vitals and nursing note reviewed.  Constitutional:      General: He is not in acute distress.    Appearance: Normal appearance. He is not ill-appearing.     Comments: Patient holding his left ribs and appears to be in pain.  Cardiovascular:     Rate and Rhythm: Normal rate and regular rhythm.     Heart sounds: Normal heart sounds.  Pulmonary:     Effort: Pulmonary effort is normal.     Breath sounds: Normal breath sounds and air entry.  Comments: Patient speaking in full sentences and is ambulatory in the clinic without increased work of breathing.  Patient does appear to be in pain with taking deep breaths. Chest:     Chest wall: Tenderness present. No deformity, swelling, crepitus or edema.     Comments: Left lateral lower chest wall tenderness around ribs 8-10.  No palpable crepitus or deformities.   Skin:    General: Skin is warm and dry.  Neurological:     Mental Status: He is alert.  Psychiatric:        Mood and Affect: Mood normal.        Behavior: Behavior normal. Behavior is cooperative.      No results found for this visit on 02/24/24.   Procedures MDM:     1 Acute complicated illness or injury     Explanation of Medical Decision Making and variances from expected care:    Preliminary read of x-ray  without acute rib fracture.  No pneumothorax.  Suspect patient strained chest wall from severe coughing episode.  RX meloxicam and flexeril to treat chest wall pain and muscle spasms.  RX promethazine-DM to help suppress cough so chest wall symptoms can improve.  Patient was prescribed albuterol  inhaler at last visit which he does use whenever he experiences shortness of breath.  He has nebulizer machine at home and is requesting neb treatments for this.  RX albuterol  neb solution sent to pharmacy.  Advised patient he should follow up with PCP regarding his ongoing cough as it may be caused by underlying condition such as asthma or chronic bronchitis.  Discussed supportive care of symptoms.  ER precautions given.     Assessment requiring historian other than patient: No     Independent visualization of image, tracing, or test: No     Discussion of management with another provider: No     Risk:: Moderate          Assessment/Plan Diagnoses and all orders for this visit:  Rib pain on left side -     XR ribs 3 views unilateral including posterior Chest  Acute cough -     XR ribs 3 views unilateral including posterior Chest         Progress note signed by Gerard Gaskins, PA on 02/24/24 at 11:19 AM

## 2024-02-24 NOTE — Progress Notes (Signed)
 Justin Stephenson is a 43 y.o. male presenting to clinic for follow up on cough seen 3/14

## 2024-08-19 ENCOUNTER — Ambulatory Visit

## 2024-08-19 VITALS — BP 118/83 | HR 81 | Temp 98.1°F | Ht 65.0 in | Wt 176.8 lb

## 2024-08-19 DIAGNOSIS — F17210 Nicotine dependence, cigarettes, uncomplicated: Secondary | ICD-10-CM

## 2024-08-19 DIAGNOSIS — J4540 Moderate persistent asthma, uncomplicated: Secondary | ICD-10-CM

## 2024-08-19 MED ORDER — BUDESONIDE-FORMOTEROL FUMARATE 160-4.5 MCG/ACT IN AERO: 2.0000 | INHALATION_SPRAY | Freq: Two times a day (BID) | RESPIRATORY_TRACT | 12 refills | Status: AC

## 2024-08-19 NOTE — Progress Notes (Signed)
 Subjective:   PATIENT ID: Justin Stephenson GENDER: male DOB: 05/11/1981, MRN: [id number]   HPI 43 year old male with no prior medical history presenting to the clinic with chronic cough at least for 3 years.  Patient states that he has significant improvement in his symptoms when he takes albuterol .  He reports that the cough is seasonal mostly during wintertime but he has been experiencing symptoms more often.  Recent this year, he has been using albuterol  for his cough and chest tightness almost twice a day.  He reports significant symptom improvement with albuterol .  He also reports chest congestion and mucus production.  He is not able to recall the color of his mucus.  He denies any fevers or chills.  He denies any weight loss or loss of appetite.  He smokes half a pack a day since the age of 46 (12-pack-year smoking history).   He denies any allergies.  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  He has 2 dogs at home.  He states that when he touches the dogs and then touches his face he gets congested.   History reviewed. No pertinent past medical history.   Family History  Problem Relation Age of Onset   Cancer Mother    Hypertension Father      Social History   Socioeconomic History   Marital status: Married    Spouse name: Not on file   Number of children: Not on file   Years of education: Not on file   Highest education level: Not on file  Occupational History   Not on file  Tobacco Use   Smoking status: Every Day    Current packs/day: 0.50    Average packs/day: 0.5 packs/day for 20.7 years (10.3 ttl pk-yrs)    Types: Cigarettes    Start date: 2005   Smokeless tobacco: Never   Tobacco comments:    20 years  1/2 pack a day mmp   Vaping Use   Vaping status: Never Used  Substance and Sexual Activity   Alcohol use: Yes    Alcohol/week: 12.0 standard drinks of alcohol    Types: 12 Cans of beer per week   Drug use: Never   Sexual  activity: Yes  Other Topics Concern   Not on file  Social History Narrative   Not on file   Social Drivers of Health   Financial Resource Strain: Not on file  Food Insecurity: Not on file  Transportation Needs: Not on file  Physical Activity: Not on file  Stress: Not on file  Social Connections: Unknown (04/20/2022)   Received [facility]   Social Network    Social Network: Not on file  Intimate Partner Violence: Unknown (03/14/2022)   Received from [facility]   HITS    Physically Hurt: Not on file    Insult or Talk Down To: Not on file    Threaten Physical Harm: Not on file    Scream or Curse: Not on file     No Known Allergies   Outpatient Medications Prior to Visit  Medication Sig Dispense Refill   albuterol  (VENTOLIN  HFA) 108 (90 Base) MCG/ACT inhaler Inhale 2 puffs into the lungs every 4 (four) hours as needed for wheezing or shortness of breath. 18 g 0   amoxicillin -clavulanate (AUGMENTIN ) 875-125 MG tablet Take 1 tablet by mouth every 12 (twelve) hours. (Patient not taking: Reported on 08/19/2024) 14 tablet 0   Dextromethorphan HBr (DELSYM PO)  Take by mouth. (Patient not taking: Reported on 08/19/2024)     predniSONE  (STERAPRED UNI-PAK 21 TAB) 10 MG (21) TBPK tablet Take by mouth daily. As directed (Patient not taking: Reported on 08/19/2024) 21 tablet 0   No facility-administered medications prior to visit.    ROS Reviewed all systems and reported negative except as above     Objective:   Vitals:   08/19/24 1604  BP: 118/83  Pulse: 81  Temp: 98.1 F (36.7 C)  TempSrc: Oral  SpO2: 94%  Weight: 176 lb 12.8 oz (80.2 kg)  Height: 5' 5 (1.651 m)    Physical Exam General: Middle-aged male, healthy appearing not in acute distress Chest: Clear to auscultation Heart: Regular rate and rhythm, normal S1, normal S2     CBC    Component Value Date/Time   WBC 6.2 02/02/2009 0320   RBC 4.65 02/02/2009 0320   HGB 15.3 02/02/2009 1113   HCT 45.0  02/02/2009 1113   PLT 151 02/02/2009 0320   MCV 94.0 02/02/2009 0320   MCHC 34.1 02/02/2009 0320   RDW 13.0 02/02/2009 0320   LYMPHSABS 1.5 02/02/2009 0320   MONOABS 0.6 02/02/2009 0320   EOSABS 0.3 02/02/2009 0320   BASOSABS 0.0 02/02/2009 0320     Chest imaging:  PFT: No PFTs on file   Labs: None recent laboratory workup   Echo:   No echo on file    Assessment & Plan:   Assessment & Plan Moderate persistent asthma, unspecified whether complicated Patient clinically describing symptoms suggestive of asthma.  This is less likely to be COPD as his smoking history and age are not consistent with COPD although still possible. Plan to obtain PFTs.  In the meantime started on LABA ICS inhaler to assess if he has symptomatic improvement. Minimal GERD symptoms.  No postnasal drip.  No recent infectious etiology. Follow-up in 3 months  Orders Placed This Encounter  Procedures   CBC w/Diff   RESPIRATORY ALLERGY  [facility] II W/ RFLX: [place]    Standing Status:   Future    Expected Date:   08/19/2024    Expiration Date:   08/19/2025   Pulmonary function test    Please do a methacholine challenge test    Standing Status:   Future    Expiration Date:   08/19/2025    Where should this test be performed?:   Outpatient Pulmonary    What type of PFT is being ordered?:   Full PFT      Zola Herter, MD [facility] Office: [phone]

## 2024-08-19 NOTE — Patient Instructions (Signed)
 It was nice seeing in the clinic today.  Your cough and chest tightness with mucus production that improved with albuterol  could be related to asthma.  We will obtain pulmonary function tests to look into that.  In the meantime I suggest that you start using Symbicort  2 puffs twice a day and rinse your mouth after you use it.   Will also check your complete blood count and blood allergy  panel to further evaluate you for allergies.  Please let us  know in 2 weeks how you feel with the use of the inhaler.

## 2024-08-20 LAB — CBC WITH DIFFERENTIAL/PLATELET
Basophils Absolute: 0.1 K/uL (ref 0.0–0.1)
Basophils Relative: 1.1 % (ref 0.0–3.0)
Eosinophils Absolute: 0.7 K/uL (ref 0.0–0.7)
Eosinophils Relative: 7.5 % — ABNORMAL HIGH (ref 0.0–5.0)
HCT: 45.4 % (ref 39.0–52.0)
Hemoglobin: 15.1 g/dL (ref 13.0–17.0)
Lymphocytes Relative: 30.4 % (ref 12.0–46.0)
Lymphs Abs: 2.8 K/uL (ref 0.7–4.0)
MCHC: 33.4 g/dL (ref 30.0–36.0)
MCV: 95.3 fl (ref 78.0–100.0)
Monocytes Absolute: 0.8 K/uL (ref 0.1–1.0)
Monocytes Relative: 8.3 % (ref 3.0–12.0)
Neutro Abs: 4.9 K/uL (ref 1.4–7.7)
Neutrophils Relative %: 52.7 % (ref 43.0–77.0)
Platelets: 205 K/uL (ref 150.0–400.0)
RBC: 4.76 Mil/uL (ref 4.22–5.81)
RDW: 13.5 % (ref 11.5–15.5)
WBC: 9.3 K/uL (ref 4.0–10.5)

## 2024-08-21 ENCOUNTER — Telehealth: Payer: Self-pay

## 2024-08-21 DIAGNOSIS — J4540 Moderate persistent asthma, uncomplicated: Secondary | ICD-10-CM

## 2024-08-21 LAB — RESPIRATORY ALLERGY PANEL REGION II W/ RFLX: ~~LOC~~
Allergen, A. alternata, m6: <0.1 kU/L
Allergen, Cedar tree, t12: <0.1 kU/L
Allergen, Comm Silver Birch, t9: <0.1 kU/L
Allergen, Cottonwood, t14: <0.1 kU/L
Allergen, D pternoyssinus,d7: 0.54 kU/L — ABNORMAL HIGH
Allergen, Mouse Urine Protein, e78: <0.1 kU/L
Allergen, Mulberry, t76: <0.1 kU/L
Allergen, Oak,t7: <0.1 kU/L
Allergen, P. notatum, m1: <0.1 kU/L
Aspergillus fumigatus, m3: <0.1 kU/L
Bermuda Grass: <0.1 kU/L
Box Elder IgE: <0.1 kU/L
CLADOSPORIUM HERBARUM (M2) IGE: <0.1 kU/L
COMMON RAGWEED (SHORT) (W1) IGE: <0.1 kU/L
Cat Dander: 3.16 kU/L — ABNORMAL HIGH
Class: 0
Class: 0
Class: 0
Class: 0
Class: 0
Class: 0
Class: 0
Class: 0
Class: 0
Class: 0
Class: 0
Class: 0
Class: 0
Class: 0
Class: 0
Class: 0
Class: 0
Class: 0
Class: 0
Class: 1
Class: 2
Class: 6
Cockroach: 0.27 kU/L — ABNORMAL HIGH
D. farinae: 0.1 kU/L — ABNORMAL HIGH
Dog Dander: >100 kU/L — ABNORMAL HIGH
Elm IgE: <0.1 kU/L
IgE (Immunoglobulin E), Serum: 690 kU/L — ABNORMAL HIGH (ref <=114)
Johnson Grass: <0.1 kU/L
Pecan/Hickory Tree IgE: <0.1 kU/L
Rough Pigweed  IgE: <0.1 kU/L
Sheep Sorrel IgE: <0.1 kU/L
Timothy Grass: <0.1 kU/L

## 2024-08-21 LAB — INTERPRETATION:

## 2024-08-21 LAB — DOG DANDER COMPONENT
Can f 4(e229) IgE: <0.1 kU/L (ref <0.10)
Can f 6(e230) IgE: 33.7 kU/L — ABNORMAL HIGH (ref <0.10)
E101-IgE Can f 1: >100 kU/L — ABNORMAL HIGH (ref <0.10)
E102-IgE Can f 2: >100 kU/L — ABNORMAL HIGH (ref <0.10)
E221-IgE Can f 3: 0.27 kU/L — ABNORMAL HIGH (ref <0.10)
E226-IgE Can f 5: >100 kU/L — ABNORMAL HIGH (ref <0.10)

## 2024-08-21 LAB — CAT DANDER COMPONENT
E220-IgE Fel d 2: <0.1 kU/L (ref <0.10)
E228-IgE Fel d 4: 0.13 kU/L — ABNORMAL HIGH (ref <0.10)
Fel d 1 (e94) IgE: <0.1 kU/L (ref <0.10)
Fel d 7 (e231) IgE: 16.5 kU/L — ABNORMAL HIGH (ref <0.10)

## 2024-08-21 MED ORDER — ALBUTEROL SULFATE (2.5 MG/3ML) 0.083% IN NEBU: 2.5000 mg | INHALATION_SOLUTION | Freq: Four times a day (QID) | RESPIRATORY_TRACT | 5 refills | Status: AC | PRN

## 2024-08-21 MED ORDER — ALBUTEROL SULFATE (2.5 MG/3ML) 0.083% IN NEBU: 2.5000 mg | INHALATION_SOLUTION | Freq: Four times a day (QID) | RESPIRATORY_TRACT | 5 refills | Status: DC | PRN

## 2024-08-21 NOTE — Telephone Encounter (Signed)
 Discussed with patient allergy  panel, including dog dander, he has two dogs at home. Offered to add an anti histamine but patient would like to hold off. He is asking to start albuterol  nebs. He states that his symbicort  has helped.   Will follow up in the clinic to further discuss.   Zola Herter, MD [facility] Office: [phone]   See Amion for personal pager PCCM on call pager [phone] until 7pm. Please call Elink 7p-7a. [phone]\
